# Patient Record
Sex: Male | Born: 1950 | Race: White | Hispanic: No | Smoking: Former smoker
Health system: Southern US, Community
[De-identification: ages and names within clinical notes are randomized; demographics above are authoritative.]

## PROBLEM LIST (undated history)

## (undated) DIAGNOSIS — E079 Disorder of thyroid, unspecified: Secondary | ICD-10-CM

## (undated) DIAGNOSIS — K649 Unspecified hemorrhoids: Secondary | ICD-10-CM

## (undated) DIAGNOSIS — G2581 Restless legs syndrome: Secondary | ICD-10-CM

## (undated) HISTORY — DX: Disorder of thyroid, unspecified: E07.9

## (undated) HISTORY — DX: Restless legs syndrome: G25.81

## (undated) HISTORY — PX: VASECTOMY: SHX75

## (undated) HISTORY — DX: Unspecified hemorrhoids: K64.9

## (undated) HISTORY — PX: HEMORRHOID BANDING: SHX5850

---

## 2000-01-26 HISTORY — PX: SHOULDER SURGERY: SHX246

## 2006-12-02 ENCOUNTER — Observation Stay (HOSPITAL_COMMUNITY): Admission: EM | Admit: 2006-12-02 | Discharge: 2006-12-03 | Payer: Self-pay | Admitting: Emergency Medicine
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
## 2010-11-03 LAB — DIFFERENTIAL
Basophils Relative: 0
Eosinophils Absolute: 0.1
Eosinophils Relative: 1
Monocytes Absolute: 0.7
Neutro Abs: 11.7 — ABNORMAL HIGH
Neutrophils Relative %: 82 — ABNORMAL HIGH

## 2010-11-03 LAB — URINALYSIS, ROUTINE W REFLEX MICROSCOPIC
Glucose, UA: NEGATIVE
Protein, ur: NEGATIVE
Specific Gravity, Urine: 1.046 — ABNORMAL HIGH

## 2010-11-03 LAB — SAMPLE TO BLOOD BANK

## 2010-11-03 LAB — CBC
HCT: 46.9
Hemoglobin: 16.1
Platelets: 302
RDW: 13.3

## 2010-11-03 LAB — POCT I-STAT CREATININE: Creatinine, Ser: 0.9

## 2010-11-03 LAB — I-STAT 8, (EC8 V) (CONVERTED LAB)
Bicarbonate: 21.5
Glucose, Bld: 132 — ABNORMAL HIGH
Operator id: 272551
Sodium: 135
TCO2: 22
pCO2, Ven: 25.4 — ABNORMAL LOW
pH, Ven: 7.534 — ABNORMAL HIGH

## 2010-11-03 LAB — HEMOGLOBIN: Hemoglobin: 13.8

## 2011-01-08 ENCOUNTER — Ambulatory Visit (INDEPENDENT_AMBULATORY_CARE_PROVIDER_SITE_OTHER): Payer: Self-pay | Admitting: General Surgery

## 2011-02-11 ENCOUNTER — Ambulatory Visit (INDEPENDENT_AMBULATORY_CARE_PROVIDER_SITE_OTHER): Payer: 59 | Admitting: Surgery

## 2011-02-11 ENCOUNTER — Encounter (INDEPENDENT_AMBULATORY_CARE_PROVIDER_SITE_OTHER): Payer: Self-pay | Admitting: Surgery

## 2011-02-11 DIAGNOSIS — K649 Unspecified hemorrhoids: Secondary | ICD-10-CM

## 2011-02-11 NOTE — Patient Instructions (Signed)
1.  Soak bottom (sitz baths) for discomfort or flair up.  2.  Use topical agents as needed.
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
## 2011-02-25 ENCOUNTER — Encounter (INDEPENDENT_AMBULATORY_CARE_PROVIDER_SITE_OTHER): Payer: Self-pay

## 2011-03-26 ENCOUNTER — Encounter (INDEPENDENT_AMBULATORY_CARE_PROVIDER_SITE_OTHER): Payer: 59 | Admitting: Surgery

## 2012-10-31 ENCOUNTER — Encounter (INDEPENDENT_AMBULATORY_CARE_PROVIDER_SITE_OTHER): Payer: Self-pay | Admitting: General Surgery

## 2012-10-31 ENCOUNTER — Ambulatory Visit (INDEPENDENT_AMBULATORY_CARE_PROVIDER_SITE_OTHER): Payer: BC Managed Care – PPO | Admitting: General Surgery

## 2012-10-31 VITALS — BP 110/72 | HR 70 | Temp 97.4°F | Resp 16 | Ht 73.0 in | Wt 179.4 lb

## 2012-10-31 DIAGNOSIS — K645 Perianal venous thrombosis: Secondary | ICD-10-CM

## 2012-10-31 NOTE — Patient Instructions (Signed)
Hemorrhoids Hemorrhoids are swollen veins around the rectum or anus. There are two types of hemorrhoids:   Internal hemorrhoids. These occur in the veins just inside the rectum. They may poke through to the outside and become irritated and painful.  External hemorrhoids. These occur in the veins outside the anus and can be felt as a painful swelling or hard lump near the anus. CAUSES  Pregnancy.   Obesity.   Constipation or diarrhea.   Straining to have a bowel movement.   Sitting for long periods on the toilet.  Heavy lifting or other activity that caused you to strain.  Anal intercourse. SYMPTOMS   Pain.   Anal itching or irritation.   Rectal bleeding.   Fecal leakage.   Anal swelling.   One or more lumps around the anus.  DIAGNOSIS  Your caregiver may be able to diagnose hemorrhoids by visual examination. Other examinations or tests that may be performed include:   Examination of the rectal area with a gloved hand (digital rectal exam).   Examination of anal canal using a small tube (scope).   A blood test if you have lost a significant amount of blood.  A test to look inside the colon (sigmoidoscopy or colonoscopy). TREATMENT Most hemorrhoids can be treated at home. However, if symptoms do not seem to be getting better or if you have a lot of rectal bleeding, your caregiver may perform a procedure to help make the hemorrhoids get smaller or remove them completely. Possible treatments include:   Placing a rubber band at the base of the hemorrhoid to cut off the circulation (rubber band ligation).   Injecting a chemical to shrink the hemorrhoid (sclerotherapy).   Using a tool to burn the hemorrhoid (infrared light therapy).   Surgically removing the hemorrhoid (hemorrhoidectomy).   Stapling the hemorrhoid to block blood flow to the tissue (hemorrhoid stapling).  HOME CARE INSTRUCTIONS   Eat foods with fiber, such as whole grains, beans,  nuts, fruits, and vegetables. Ask your doctor about taking products with added fiber in them (fibersupplements).  Increase fluid intake. Drink enough water and fluids to keep your urine clear or pale yellow.   Exercise regularly.   Go to the bathroom when you have the urge to have a bowel movement. Do not wait.   Avoid straining to have bowel movements.   Keep the anal area dry and clean. Use wet toilet paper or moist towelettes after a bowel movement.   Medicated creams and suppositories may be used or applied as directed.   Only take over-the-counter or prescription medicines as directed by your caregiver.   Take warm sitz baths for 15 20 minutes, 3 4 times a day to ease pain and discomfort.   Place ice packs on the hemorrhoids if they are tender and swollen. Using ice packs between sitz baths may be helpful.   Put ice in a plastic bag.   Place a towel between your skin and the bag.   Leave the ice on for 15 20 minutes, 3 4 times a day.   Do not use a donut-shaped pillow or sit on the toilet for long periods. This increases blood pooling and pain.  SEEK MEDICAL CARE IF:  You have increasing pain and swelling that is not controlled by treatment or medicine.  You have uncontrolled bleeding.  You have difficulty or you are unable to have a bowel movement.  You have pain or inflammation outside the area of the hemorrhoids. MAKE SURE YOU:    Understand these instructions.  Will watch your condition.  Will get help right away if you are not doing well or get worse. Document Released: 01/09/2000 Document Revised: 12/29/2011 Document Reviewed: 11/16/2011 [employer] Patient Information 2014 [employer].  

## 2012-10-31 NOTE — Progress Notes (Signed)
Subjective:     Patient ID: Eddie Herrera, male   DOB: [birth year], 62 y.o.   MRN: [id number]  HPI This is a 62 year old healthy male who has been seen in our office by Dr. Ezzard Standing before beforehand for hemorrhoids. He is at one-handed before. For the last month he's had some intermittent bright red blood noticed. Over the last 24 hours he noticed some rectal pain as well as a mass in his pocket. This is painful with bowel movements. He comes in today to have this evaluated. He does not have the greatest toilet habits and spends a fair amount of time having a bowel movement.  Review of Systems  Constitutional: Negative for fever, chills and unexpected weight change.  HENT: Negative for hearing loss, congestion, sore throat, trouble swallowing and voice change.   Eyes: Negative for visual disturbance.  Respiratory: Negative for cough and wheezing.   Cardiovascular: Negative for chest pain, palpitations and leg swelling.  Gastrointestinal: Positive for rectal pain. Negative for nausea, vomiting, abdominal pain, diarrhea, constipation, blood in stool, abdominal distention and anal bleeding.  Genitourinary: Negative for hematuria and difficulty urinating.  Musculoskeletal: Negative for arthralgias.  Skin: Negative for rash and wound.  Neurological: Negative for seizures, syncope, weakness and headaches.  Hematological: Negative for adenopathy. Does not bruise/bleed easily.  Psychiatric/Behavioral: Negative for confusion.       Objective:   Physical Exam  Constitutional: He appears well-developed and well-nourished.  Genitourinary: Rectal exam shows external hemorrhoid (thrombosed external hemorrhoid ) and tenderness.       Assessment:     Thrombosed external hemorrhoid     Plan:     This is been present only for about 24 hours. We discussed conservative versus incise and drain it. He did be reasonable and help his symptoms to incise and drain this. We discussed may need a more definitive  procedure if still is bothered in bowel habits follow up with Dr. Ezzard Standing in about 4 weeks. After discussing this I cleansed the area. I infiltrated lidocaine. I then made an incision and evacuated the clot. This was much softer after completing this. Dressings were placed. He will begin taking baths.

## 2012-12-06 ENCOUNTER — Encounter (INDEPENDENT_AMBULATORY_CARE_PROVIDER_SITE_OTHER): Payer: Self-pay | Admitting: Surgery

## 2012-12-06 ENCOUNTER — Ambulatory Visit (INDEPENDENT_AMBULATORY_CARE_PROVIDER_SITE_OTHER): Payer: BC Managed Care – PPO | Admitting: Surgery

## 2012-12-06 VITALS — BP 110/68 | HR 62 | Temp 97.4°F | Resp 14 | Ht 72.0 in | Wt 185.2 lb

## 2012-12-06 DIAGNOSIS — K649 Unspecified hemorrhoids: Secondary | ICD-10-CM
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
## 2012-12-20 ENCOUNTER — Ambulatory Visit (INDEPENDENT_AMBULATORY_CARE_PROVIDER_SITE_OTHER): Payer: BC Managed Care – PPO | Admitting: Family Medicine

## 2012-12-20 ENCOUNTER — Encounter: Payer: Self-pay | Admitting: Family Medicine

## 2012-12-20 VITALS — BP 113/71 | HR 71 | Ht 73.0 in | Wt 180.0 lb

## 2012-12-20 DIAGNOSIS — M25511 Pain in right shoulder: Secondary | ICD-10-CM

## 2012-12-20 DIAGNOSIS — M25519 Pain in unspecified shoulder: Secondary | ICD-10-CM

## 2012-12-20 MED ORDER — NITROGLYCERIN 0.2 MG/HR TD PT24: MEDICATED_PATCH | TRANSDERMAL | Status: DC

## 2012-12-20 NOTE — Patient Instructions (Addendum)
You have rotator cuff impingement Try to avoid painful activities (overhead activities, lifting with extended arm) as much as possible. Aleve 2 tabs twice a day with food OR ibuprofen 3 tabs three times a day with food for pain and inflammation. Can take tylenol in addition to this. Continue physical therapy and home exercises. Start nitro patches - 1/4th of a patch and change daily. Consider repeat injection if not improving. Follow up in 6 weeks.

## 2012-12-25 ENCOUNTER — Encounter: Payer: Self-pay | Admitting: Family Medicine

## 2012-12-25 DIAGNOSIS — M25511 Pain in right shoulder: Secondary | ICD-10-CM | POA: Insufficient documentation

## 2012-12-25 NOTE — Progress Notes (Signed)
Patient ID: Eddie Herrera, male   DOB: [birth year], 62 y.o.   MRN: [id number]  PCP: Juline Patch, MD  Subjective:   HPI: Patient is a 62 y.o. male here for right shoulder pain.  Patient reports having problems with right shoulder for several years. Had surgery for impingement in 2001 but doesn't believe they did an acromioplasty with this. Records not available for this. Overall improved but two years ago pain was getting worse again - last MRI at that time without evidence of tear but also not available. Went to see Dr. Jorge Mandril earlier this year - had subacromial injection which helped for 1 month. Has been seeing Ellamae Sia for PT also. + night pain. Now constant, worse with most shoulder motions.  History reviewed. No pertinent past medical history.  Current Outpatient Prescriptions on File Prior to Visit  Medication Sig Dispense Refill  . Melatonin-Pyridoxine (MELATIN PO) Take by mouth.      . Multiple Vitamin (MULTI VITAMIN DAILY PO) Take by mouth.      . saw palmetto 500 MG capsule Take 600 mg by mouth daily.       No current facility-administered medications on file prior to visit.    Past Surgical History  Procedure Laterality Date  . Vasectomy    . Hemorrhoid banding    . Shoulder surgery  2002    Right Shoulder    No Known Allergies  History   Social History  . Marital Status: Divorced    Spouse Name: N/A    Number of Children: N/A  . Years of Education: N/A   Occupational History  . Not on file.   Social History Main Topics  . Smoking status: Former Smoker    Quit date: 02/10/1974  . Smokeless tobacco: Not on file  . Alcohol Use: Yes  . Drug Use: No  . Sexual Activity: Not on file   Other Topics Concern  . Not on file   Social History Narrative  . No narrative on file    Family History  Problem Relation Age of Onset  . Heart attack Father   . Hypertension Father   . Hypertension Mother   . Hyperlipidemia Brother   . Diabetes Neg Hx   .  Sudden death Neg Hx     BP 113/71  Pulse 71  Ht 6\' 1"  (1.854 m)  Wt 180 lb (81.647 kg)  BMI 23.75 kg/m2  Review of Systems: See HPI above.    Objective:  Physical Exam:  Gen: NAD  Right shoulder: No swelling, ecchymoses.  No gross deformity. No TTP AC joint, biceps tendon. FROM with painful arc. Mild positive Hawkins, Neers. Negative Yergasons. Strength 5/5 with empty can and resisted internal/external rotation.   Negative apprehension. NV intact distally.     Assessment & Plan:  1. Right shoulder pain - 2/2 rotator cuff impingement.  Avoid painful activities.  Had surgery for this 13 years ago.  Had injection earlier this year but only helped for 1 month.  Doing physical therapy already.  Will add nitro patches.  Consider repeat injection, MRI if not improving.  F/u in 6 weeks.

## 2012-12-25 NOTE — Assessment & Plan Note (Signed)
2/2 rotator cuff impingement.  Avoid painful activities.  Had surgery for this 13 years ago.  Had injection earlier this year but only helped for 1 month.  Doing physical therapy already.  Will add nitro patches.  Consider repeat injection, MRI if not improving.  F/u in 6 weeks.

## 2013-06-20 ENCOUNTER — Other Ambulatory Visit: Payer: Self-pay | Admitting: Family Medicine

## 2013-06-20 DIAGNOSIS — M79609 Pain in unspecified limb: Secondary | ICD-10-CM

## 2013-06-22 ENCOUNTER — Ambulatory Visit
Admission: RE | Admit: 2013-06-22 | Discharge: 2013-06-22 | Disposition: A | Discharge status: Home / Self Care | Payer: BC Managed Care – PPO | Source: Ambulatory Visit | Attending: Family Medicine | Admitting: Family Medicine

## 2013-06-22 ENCOUNTER — Encounter (INDEPENDENT_AMBULATORY_CARE_PROVIDER_SITE_OTHER): Payer: Self-pay

## 2013-06-22 DIAGNOSIS — M79609 Pain in unspecified limb: Secondary | ICD-10-CM

## 2013-12-19 ENCOUNTER — Encounter (INDEPENDENT_AMBULATORY_CARE_PROVIDER_SITE_OTHER): Payer: BC Managed Care – PPO

## 2013-12-19 ENCOUNTER — Encounter: Payer: Self-pay | Admitting: *Deleted

## 2013-12-19 ENCOUNTER — Other Ambulatory Visit: Payer: Self-pay | Admitting: *Deleted

## 2013-12-19 DIAGNOSIS — R002 Palpitations: Secondary | ICD-10-CM

## 2013-12-19 NOTE — Progress Notes (Signed)
Patient ID: Eddie Herrera, male   DOB: 11/14/1950, 63 y.o.   MRN: [id number] [employer] 24 hour holter monitor applied to patient.

## 2014-01-22 DIAGNOSIS — R5383 Other fatigue: Secondary | ICD-10-CM | POA: Insufficient documentation

## 2014-02-18 ENCOUNTER — Encounter: Payer: Self-pay | Admitting: *Deleted

## 2014-02-18 DIAGNOSIS — K625 Hemorrhage of anus and rectum: Secondary | ICD-10-CM | POA: Insufficient documentation

## 2014-02-18 DIAGNOSIS — K648 Other hemorrhoids: Secondary | ICD-10-CM | POA: Insufficient documentation

## 2014-02-18 DIAGNOSIS — G2581 Restless legs syndrome: Secondary | ICD-10-CM | POA: Insufficient documentation

## 2014-02-19 ENCOUNTER — Ambulatory Visit (INDEPENDENT_AMBULATORY_CARE_PROVIDER_SITE_OTHER): Payer: 59 | Admitting: Cardiology

## 2014-02-19 ENCOUNTER — Encounter: Payer: Self-pay | Admitting: Cardiology

## 2014-02-19 VITALS — BP 124/82 | HR 71 | Ht 73.0 in | Wt 189.0 lb

## 2014-02-19 DIAGNOSIS — R5383 Other fatigue: Secondary | ICD-10-CM

## 2014-02-19 DIAGNOSIS — R5381 Other malaise: Secondary | ICD-10-CM | POA: Insufficient documentation

## 2014-02-19 DIAGNOSIS — R002 Palpitations: Secondary | ICD-10-CM

## 2014-02-19 NOTE — Progress Notes (Signed)
Logon Lum Keas Date of Birth:  July 05, 1950 [facility] [address] [place], [place]  [address] [phone]        Fax   [phone]   History of Present Illness: This pleasant 64 year old gentleman is seen by me for the first time today.  He is a medical patient of Dr. Maurice Small.  He is being seen because of complaints of intermittent palpitations.  His EKG done at Dr. Jone Baseman office on 12/18/13 showed normal sinus rhythm at 60/m and no premature beats.  He subsequently also had a Holter monitor which did not show any arrhythmia.  He continues to be very concerned and bothered by the sensation that his heart is skipping and jumping, usually when he is at rest.  He mentioned that these symptoms seem to have started after his partner died in 2022/10/24.  She was found dead in her bed of apparent natural causes.  An autopsy was ordered but results are not back. The patient has been in generally good health.  He takes.  Few medications and does not have any past history of hypertension or ischemic heart disease.  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  He does not drink much caffeine.  When he is not flying he drinks an occasional glass of wine.  He complains of lack of energy and malaise and fatigue.  Current Outpatient Prescriptions  Medication Sig Dispense Refill  . Melatonin-Pyridoxine (MELATIN PO) Take 3 mg by mouth.     . Multiple Vitamin (MULTI VITAMIN DAILY PO) Take by mouth.    . Saw Palmetto, Serenoa repens, (SAW PALMETTO PO) 2 (two) times daily. 2 capsules     No current facility-administered medications for this visit.    No Known Allergies  Patient Active Problem List   Diagnosis Date Noted  . Intermittent palpitations 02/19/2014  . Malaise and fatigue 02/19/2014  . Internal hemorrhoids without complication 02/18/2014  . Anal bleeding 02/18/2014  . Restless leg 02/18/2014  . Exhaustion 01/22/2014  . Right shoulder pain  12/25/2012  . Hemorrhoids 02/11/2011    History  Smoking status  . Former Smoker  . Quit date: 02/10/1974  Smokeless tobacco  . Not on file    History  Alcohol Use  . Yes    Family History  Problem Relation Age of Onset  . Heart attack Father   . Hypertension Father   . Hypertension Mother   . Hyperlipidemia Brother   . Diabetes Neg Hx   . Sudden death Neg Hx     Review of Systems: Constitutional: no fever chills diaphoresis or fatigue or change in weight.  Head and neck: no hearing loss, no epistaxis, no photophobia or visual disturbance. Respiratory: No cough, shortness of breath or wheezing. Cardiovascular: No chest pain peripheral edema, positive for palpitations usually at rest Gastrointestinal: No abdominal distention, no abdominal pain, no change in bowel habits hematochezia or melena. Genitourinary: No dysuria, no frequency, no urgency, no nocturia. Musculoskeletal:No arthralgias, no back pain, no gait disturbance or myalgias. Neurological: No dizziness, no headaches, no numbness, no seizures, no syncope, no weakness, no tremors. Hematologic: No lymphadenopathy, no easy bruising. Psychiatric: No confusion, no hallucinations, no sleep disturbance.   Wt Readings from Last 3 Encounters:  02/19/14 189 lb (85.73 kg)  12/20/12 180 lb (81.647 kg)  12/06/12 185 lb 3.2 oz (84.006 kg)    Physical Exam: Filed Vitals:   02/19/14 1132  BP: 124/82  Pulse: 71  The patient appears to be in no distress.  Head and neck exam reveals that the pupils are equal and reactive.  The extraocular movements are full.  There is no scleral icterus.  Mouth and pharynx are benign.  No lymphadenopathy.  No carotid bruits.  The jugular venous pressure is normal.  Thyroid is not enlarged or tender.  Chest is clear to percussion and auscultation.  No rales or rhonchi.  Expansion of the chest is symmetrical.  Heart reveals no abnormal lift or heave.  First and second heart sounds are  normal.  There is no murmur gallop rub or click.  The abdomen is soft and nontender.  Bowel sounds are normoactive.  There is no hepatosplenomegaly or mass.  There are no abdominal bruits.  Extremities reveal no phlebitis or edema.  Pedal pulses are good.  There is no cyanosis or clubbing.  Neurologic exam is normal strength and no lateralizing weakness.  No sensory deficits.  Integument reveals no rash  EKG today shows normal sinus rhythm at 64/m and was within normal limits.  Assessment / Plan: 1.  Intermittent palpitations usually at rest.  Patient is quite concerned about these.  He has not had dizziness or syncope associated with these palpitations.  These appear to be a sensation of fluttering in his chest and hard area and up into his neck.  He has not had what sounds like sustained tachycardia. 2.  Malaise and fatigue.  Dr. Valentina LucksGriffin has done extensive lab work all of which has turned out okay.  Disposition: We will have the patient wear a 30 day event monitor since his palpitations do not occur every night.  His previous Holter monitor did not show any arrhythmia.. No medications prescribed.  He was encouraged to get plenty of regular aerobic exercise. Many thanks for the opportunity to see this gentleman with you.  We will be glad to see him again on a when necessary basis but did not make a follow-up visit today.  We will be in touch regarding the results of his 30 day event monitor.

## 2014-02-19 NOTE — Patient Instructions (Signed)
Your physician has recommended that you wear an event monitor. Event monitors are medical devices that record the heart's electrical activity. Doctors most often us these monitors to diagnose arrhythmias. Arrhythmias are problems with the speed or rhythm of the heartbeat. The monitor is a small, portable device. You can wear one while you do your normal daily activities. This is usually used to diagnose what is causing palpitations/syncope (passing out).  Your physician recommends that you continue on your current medications as directed. Please refer to the Current Medication list given to you today.  FOLLOW UP AS NEEDED

## 2014-02-20 ENCOUNTER — Encounter: Payer: Self-pay | Admitting: *Deleted

## 2014-02-20 ENCOUNTER — Encounter (INDEPENDENT_AMBULATORY_CARE_PROVIDER_SITE_OTHER): Payer: 59

## 2014-02-20 DIAGNOSIS — R5383 Other fatigue: Secondary | ICD-10-CM

## 2014-02-20 DIAGNOSIS — R002 Palpitations: Secondary | ICD-10-CM

## 2014-02-20 DIAGNOSIS — R5381 Other malaise: Secondary | ICD-10-CM

## 2014-02-20 NOTE — Progress Notes (Signed)
Patient ID: Eddie Herrera, male   DOB: 06/11/1950, 64 y.o.   MRN: [id number] [employer] 30 day cardiac event monitor applied to patient.

## 2014-04-03 ENCOUNTER — Ambulatory Visit (INDEPENDENT_AMBULATORY_CARE_PROVIDER_SITE_OTHER): Payer: 59 | Admitting: Cardiology

## 2014-04-03 ENCOUNTER — Encounter: Payer: Self-pay | Admitting: Cardiology

## 2014-04-03 VITALS — BP 124/82 | HR 67 | Ht 73.0 in | Wt 191.0 lb

## 2014-04-03 DIAGNOSIS — R0609 Other forms of dyspnea: Secondary | ICD-10-CM

## 2014-04-03 NOTE — Patient Instructions (Signed)
Your physician has requested that you have en exercise stress myoview. For further information please visit [email or web address]. Please follow instruction sheet, as given.  Your physician recommends that you schedule a follow-up appointment in: 3 MONTH OV

## 2014-04-03 NOTE — Progress Notes (Signed)
Cardiology Office Note   Date:  04/03/2014   ID:  Eddie Herrera, DOB 12/12/1950, MRN [id number]  PCP:  Eddie Herrera,Eddie Herrera, Eddie Herrera  Cardiologist:   Eddie Herrera, Eddie Herrera   No chief complaint on file.     History of Present Illness: Eddie Herrera is a 64 y.o. male who presents for follow-up office visit.  This pleasant 64 year old gentleman is seen by me for a follow-up visit today. He is a medical patient of Eddie Herrera. He is being seen because of complaints of intermittent palpitations. His EKG done at Eddie Herrera's office on 12/18/13 showed normal sinus rhythm at 60/m and no premature beats. He subsequently also had a Holter monitor which did not show any arrhythmia. He continues to be very concerned and bothered by the sensation that his heart is skipping and jumping, usually when he is at rest. He mentioned that these symptoms seem to have started after his partner died in September. She was found dead in her bed of apparent natural causes.  The patient has been in generally good health. He takes. Few medications and does not have any past history of hypertension or ischemic heart disease. 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 He does not drink much caffeine. When he is not flying he drinks an occasional glass of wine. He complains of lack of energy and malaise and fatigue.  He has noted some exertional dyspnea over the past. After we saw him on his initial visit he wore a event monitor for 30 days.  The event monitor did show short runs of consecutive premature atrial beats.  He did not have any atrial fibrillation.  He did not have any significant bradycardic events.  He did not have any significant ventricular arrhythmias.  We reviewed those monitor strips today.  Past Medical History  Diagnosis Date  . RLS (restless legs syndrome)   . Hemorrhoids   . Thyroid disease     Past Surgical History  Procedure Laterality Date  . Vasectomy    .  Hemorrhoid banding    . Shoulder surgery  2002    Right Shoulder     Current Outpatient Prescriptions  Medication Sig Dispense Refill  . Melatonin-Pyridoxine (MELATIN PO) Take 3 mg by mouth.     . Multiple Vitamin (MULTI VITAMIN DAILY PO) Take by mouth.    . Saw Palmetto, Serenoa repens, (SAW PALMETTO PO) 2 (two) times daily. 2 capsules     No current facility-administered medications for this visit.    Allergies:   Review of patient's allergies indicates no known allergies.    Social History:  The patient  reports that he quit smoking about 40 years ago. He does not have any smokeless tobacco history on file. He reports that he drinks alcohol. He reports that he does not use illicit drugs.   Family History:  The patient's family history includes Heart attack in his father; Hyperlipidemia in his brother; Hypertension in his father and mother. There is no history of Diabetes or Sudden death.    ROS:  Please see the history of present illness.   Otherwise, review of systems are positive for none.   All other systems are reviewed and negative.    PHYSICAL EXAM: VS:  BP 124/82 mmHg  Pulse 67  Ht 6\' 1"  (1.854 m)  Wt 191 lb (86.637 kg)  BMI 25.20 kg/m2 , BMI Body mass index is 25.2 kg/(m^2). GEN: Well nourished, well developed, in  no acute distress HEENT: normal Neck: no JVD, carotid bruits, or masses Cardiac: RRR; no murmurs, rubs, or gallops,no edema  Respiratory:  clear to auscultation bilaterally, normal work of breathing GI: soft, nontender, nondistended, + BS MS: no deformity or atrophy Skin: warm and dry, no rash Neuro:  Strength and sensation are intact Psych: euthymic mood, full affect   EKG:  EKG is not ordered today.    Recent Labs: No results found for requested labs within last 365 days.    Lipid Panel No results found for: CHOL, TRIG, HDL, CHOLHDL, VLDL, LDLCALC, LDLDIRECT    Wt Readings from Last 3 Encounters:  04/03/14 191 lb (86.637 kg)  02/19/14  189 lb (85.73 kg)  12/20/12 180 lb (81.647 kg)      Other studies Reviewed: Additional studies/ records that were reviewed today include: Review of 30 day event monitor Review of the above records demonstrates no ventricular arrhythmias.  He does have intermittent atrial arrhythmias but no atrial fibrillation   ASSESSMENT AND PLAN:  1.  Symptomatic premature atrial beats.  These began after the death of his partner.  The symptoms appear to be gradually subsiding. 2.  Fatigue which is unusual for him 3.  Dyspnea on exertion 4.  Sleep disturbance related to stress.  He has been taking an over-the-counter sleep medication which has helped.  Disposition: The patient is concerned about his heart.  He is concerned about his exertional dyspnea and his fatigue.  He is a Control and instrumentation engineer.  We will have him return for a treadmill Myoview stress test.  His resting EKGs have shown mild interventricular conduction disturbance. Recheck in 3 months for office visit.  Consider empiric therapy of his premature atrial beats with a low dose beta blocker or diltiazem however he seems to be improving on his own so we will hold off on any medication at this point.  Current medicines are reviewed at length with the patient today.  The patient does not have concerns regarding medicines.  The following changes have been made:  no change  Labs/ tests ordered today include:  Orders Placed This Encounter  Procedures  . Myocardial Perfusion Imaging     Disposition:   FU with Eddie Herrera in 3 months for office visit   Signed, Eddie Clement, Eddie Herrera  04/03/2014 11:35 AM    [facility] [address] [place], [place], [place]  [address] Phone: [phone]; Fax: [phone]

## 2014-04-12 ENCOUNTER — Ambulatory Visit (HOSPITAL_COMMUNITY): Payer: 59

## 2014-04-17 ENCOUNTER — Telehealth: Payer: Self-pay | Admitting: *Deleted

## 2014-04-17 DIAGNOSIS — R0609 Other forms of dyspnea: Principal | ICD-10-CM

## 2014-04-17 NOTE — Telephone Encounter (Signed)
Insurance denied myoview, ok per  Dr. Patty SermonsBrackbill to change to GXT Order in Epic and will [facility] for [facility]

## 2014-05-10 ENCOUNTER — Encounter (HOSPITAL_COMMUNITY): Payer: 59

## 2014-06-10 ENCOUNTER — Telehealth: Payer: Self-pay | Admitting: *Deleted

## 2014-06-10 NOTE — Telephone Encounter (Signed)
-----   Message from Gerome Sameborah D Miller sent at 04/23/2014 10:07 AM EDT ----- Regarding: GXT SCHEDULE AT THE HOSP/ 05/10/14 1PM/ 04/23/14 Juliette AlcideMelinda, He will let us know if he still wants to have this test in couple days. ----- Message -----    From: Britt Bologneseharmaine M Hall    Sent: 04/12/2014   8:32 AM      To: Janeice Robinsoneborah D Miller, Hayven Croy B Carnisha Feltz Subject: RE: MPI vs GXT                                 Loleta RoseMelinda  Got the actual denial this morning.  Can you put in the order for the GXT?  Deb,  Can you call pt and set him up for a GXT since they actually denied the nuc?  Thanks  Happy Friday! ----- Message -----    From: Burnell BlanksMelinda B [place]    Sent: 04/11/2014   5:38 PM      To: Britt Bologneseharmaine M Hall Subject: RE: MPI vs GXT                                 TOTALLY FORGOT TO COME SEE YOU, SOOO SORRY!!!  Looks like you took care of this Thanks Juliette AlcideMelinda  ----- Message -----    From: Britt Bologneseharmaine M Hall    Sent: 04/11/2014  11:26 AM      To: Laveda NormanMelinda B Brizza Nathanson, Thomas Brackbill, MD Subject: MPI vs GXT                                     Dr. Patty SermonsBrackbill  Case went to medical review w/[employer] medical director d/t pt can walk on treadmill.  I previously faxed ov notes (pilot and holter results).  Test is tomorrow.  If not approved by 3pm today do you want to wait to see if they approve it or change to GXT?  Thanks  Charmaine

## 2014-06-10 NOTE — Telephone Encounter (Signed)
Patient called and cancelled GXT

## 2014-07-04 ENCOUNTER — Ambulatory Visit: Payer: 59 | Admitting: Cardiology

## 2014-12-11 ENCOUNTER — Ambulatory Visit (INDEPENDENT_AMBULATORY_CARE_PROVIDER_SITE_OTHER): Payer: 59 | Admitting: Sports Medicine

## 2014-12-11 ENCOUNTER — Encounter: Payer: Self-pay | Admitting: Sports Medicine

## 2014-12-11 VITALS — BP 130/87 | HR 69 | Ht 72.0 in | Wt 172.0 lb

## 2014-12-11 DIAGNOSIS — M7071 Other bursitis of hip, right hip: Secondary | ICD-10-CM | POA: Diagnosis not present

## 2014-12-11 MED ORDER — MELOXICAM 15 MG PO TABS: ORAL_TABLET | ORAL | Status: AC

## 2014-12-11 NOTE — Progress Notes (Signed)
Subjective:    Patient ID: Eddie Herrera, male    DOB: 02/21/1950, 64 y.o.   MRN: [id number]  HPI 64 y/o male with PMHx of RLS and known lumbar disc disease presenting on referral from primary care physician for RIGHT buttock/hip pain.    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  From July - Late September 2016, was driving about [phone] miles/day.  Pain would start approximately 1 hour into driving in the RIGHT hip's posterior portion, near the boney prominence (ischial tuberosity).  Pain was most intense in that area but had some radiation down to popliteal fossa of ipsilateral leg.  No weakness.  No  Swelling.  No parasthesia (none persistent) and no instability.  No Trauma or falls.    Patient reports pain is most severe on the ischial tuberosity and surrounding area on the right.  This is always where the pain starts.  He will frequently stop, stretch and even do tennis ball massages with limited improvement.    Despite drastically reducing his driving since returning in October, his pain has not improved.  He works part-time now as a Occupational hygienistpilot and is having pain with flying >45 minutes and when driving to visit friends if drive is longer than [phone] minutes.  Has previously seen Ronda FairlyJohn O'Halleran and was given some exercises in the spring prophylactically prior to his long summer road trip but pain has become more severe.  PMHx -- reviewed and updated in EMR; relevant portions impacting decision making: NONE PSHx -- reviewed and updated in EMR; relevant portions impacting decision making: NONE Meds -- reviewed and updated in EMR; relevant portions impacting decision making: NONE Allergies, Social Hx -- reviewed and updated in EMR  Review of Systems 10-point ROS negative other than above.     Objective:   Physical Exam General: Resting comfortably on exam table and in no acute distress  Neurologic:  Distal sensory and motor exam in bilateral lower extremities is unremarkable  Cardiopulmonary: 2+ pulses in bilateral distal lower extremity; nonlabored respiration  Psychiatric exam: Normal mood, congruent affect. Appropriate thought content   Bilateral Hip Exam: Patient's active range of motion was symmetric and appropriate in hip flexion at 90, extension at 30, internal rotation at 30, external rotation at 45 and abduction at 60 compared to the contralateral side. There was 5/5 strength in all cardinal movements of the hip. However, when supine resisted hip extension and knee flexion causes pain at the origin of the hamstring tendon/tuberosity area. This area was tender to palpation at rest and more tender to palpation during stress. There was a negative Faber, negative piriformis palpation, negative external rotation and negative log roll of the bilateral hips. Loading and circumduction of the joint revealed no popping/catching/clicking or pain concerning for acetabular or labral pathology  RIGHT Hip MSK U/S of Ischial Tuberosity: The common hamstring tendon was identified in short and long views conecting to the tuberosity on the RIGHT hip. The tuberosity/hamstring origin junction has surrounding hypoechoic changes concerning for edema. There is also some evidence of enlargement of ischial bursa. There is no evidence in short or long views of any frank rupture or tearing of the hamstring tendon.      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 The most likely culprit for this pathology was his excessive driving over the summer with prolonged hours remaining in a position of 90 knee flexion and loading the tuberosity while in a seated position.  Patient will perform eccentric  hamstring exercises to include the extender and diver exercises. He will take meloxicam 15 mg daily for 10 days then as needed. Follow up with me in three weeks. If symptoms persist, consider ischial tuberosity bursal injection.  ===

## 2015-01-01 ENCOUNTER — Ambulatory Visit (INDEPENDENT_AMBULATORY_CARE_PROVIDER_SITE_OTHER): Payer: Medicare Other | Admitting: Sports Medicine

## 2015-01-01 ENCOUNTER — Encounter: Payer: Self-pay | Admitting: Sports Medicine

## 2015-01-01 VITALS — BP 110/60 | Ht 72.0 in | Wt 172.0 lb

## 2015-01-01 DIAGNOSIS — M7071 Other bursitis of hip, right hip: Secondary | ICD-10-CM | POA: Diagnosis not present

## 2015-01-01 NOTE — Progress Notes (Signed)
   Subjective:    Patient ID: Eddie Herrera, male    DOB: 11/23/1950, 64 y.o.   MRN: [id number]  HPI   Patient comes in today for follow-up on right hip ischial tuberosity bursitis. Symptoms have improved but not resolved. He took his meloxicam for 10 days and noticed a slight improvement in his pain. Ibuprofen actually works better for him. Pain still primary present with prolonged sitting. Previous ultrasound showed fluid in the bursa next to the ischial tuberosity. He has been compliant with his home exercise program.    Review of Systems     Objective:   Physical Exam Well-developed, well-nourished. No acute distress  Right hip: Tender to palpation over the right ischial tuberosity. Smooth painless hip range of motion.  MSK ultrasound shows resolution of prior swelling in the bursa but there is calcification within the proximal hamstring tendon consistent with tendinopathy.       Assessment & Plan:  Posterior right hip pain secondary to proximal hamstring tendinopathy/ischial tuberosity bursitis  Patient will continue with his home exercises. He may continue with when necessary meloxicam or ibuprofen. Symptoms are not bad enough at this point that he wants to consider cortisone injection and in fact cortisone injections have not worked for him in the past for other injuries. I've also recommended that he try using a softer seat for long car rides. He may continue with activity as tolerated and follow-up with me as needed.
